# Patient Record
Sex: Female | Born: 1980 | Race: Black or African American | Hispanic: No | Marital: Married | Smoking: Never smoker
Health system: Southern US, Community
[De-identification: ages and names within clinical notes are randomized; demographics above are authoritative.]

## PROBLEM LIST (undated history)

## (undated) DIAGNOSIS — J301 Allergic rhinitis due to pollen: Secondary | ICD-10-CM

## (undated) DIAGNOSIS — Z8619 Personal history of other infectious and parasitic diseases: Secondary | ICD-10-CM

## (undated) DIAGNOSIS — Z789 Other specified health status: Secondary | ICD-10-CM

## (undated) HISTORY — DX: Allergic rhinitis due to pollen: J30.1

## (undated) HISTORY — DX: Personal history of other infectious and parasitic diseases: Z86.19

---

## 2011-05-13 ENCOUNTER — Other Ambulatory Visit: Payer: Self-pay

## 2011-07-30 ENCOUNTER — Other Ambulatory Visit (HOSPITAL_COMMUNITY): Payer: Self-pay | Admitting: Obstetrics and Gynecology

## 2011-07-30 DIAGNOSIS — R638 Other symptoms and signs concerning food and fluid intake: Secondary | ICD-10-CM

## 2011-08-28 ENCOUNTER — Ambulatory Visit (HOSPITAL_COMMUNITY)
Admission: RE | Admit: 2011-08-28 | Discharge: 2011-08-28 | Disposition: A | Discharge status: Home / Self Care | Payer: BC Managed Care – PPO | Source: Ambulatory Visit | Attending: Obstetrics and Gynecology | Admitting: Obstetrics and Gynecology

## 2011-08-28 ENCOUNTER — Encounter (HOSPITAL_COMMUNITY): Payer: Self-pay

## 2011-08-28 DIAGNOSIS — O358XX0 Maternal care for other (suspected) fetal abnormality and damage, not applicable or unspecified: Secondary | ICD-10-CM | POA: Insufficient documentation

## 2011-08-28 DIAGNOSIS — E669 Obesity, unspecified: Secondary | ICD-10-CM | POA: Insufficient documentation

## 2011-08-28 DIAGNOSIS — O34219 Maternal care for unspecified type scar from previous cesarean delivery: Secondary | ICD-10-CM | POA: Insufficient documentation

## 2011-08-28 DIAGNOSIS — Z363 Encounter for antenatal screening for malformations: Secondary | ICD-10-CM | POA: Insufficient documentation

## 2011-08-28 DIAGNOSIS — O99210 Obesity complicating pregnancy, unspecified trimester: Secondary | ICD-10-CM | POA: Insufficient documentation

## 2011-08-28 DIAGNOSIS — Z1389 Encounter for screening for other disorder: Secondary | ICD-10-CM | POA: Insufficient documentation

## 2011-08-28 DIAGNOSIS — R638 Other symptoms and signs concerning food and fluid intake: Secondary | ICD-10-CM

## 2011-11-21 ENCOUNTER — Encounter (HOSPITAL_COMMUNITY): Payer: Self-pay | Admitting: Pharmacist

## 2011-12-03 ENCOUNTER — Encounter (HOSPITAL_COMMUNITY): Payer: Self-pay

## 2011-12-03 ENCOUNTER — Encounter (HOSPITAL_COMMUNITY)
Admission: RE | Admit: 2011-12-03 | Discharge: 2011-12-03 | Disposition: A | Discharge status: Home / Self Care | Payer: BC Managed Care – PPO | Source: Ambulatory Visit | Attending: Obstetrics and Gynecology | Admitting: Obstetrics and Gynecology

## 2011-12-03 HISTORY — DX: Other specified health status: Z78.9

## 2011-12-03 LAB — CBC
MCV: 80.8 fL (ref 78.0–100.0)
Platelets: 146 10*3/uL — ABNORMAL LOW (ref 150–400)
RDW: 15 % (ref 11.5–15.5)
WBC: 10.2 10*3/uL (ref 4.0–10.5)

## 2011-12-03 NOTE — Patient Instructions (Addendum)
YOUR PROCEDURE IS SCHEDULED ON:12/09/11  ENTER THROUGH THE MAIN ENTRANCE OF [facility] AT:1130am  USE DESK PHONE AND DIAL [phone] TO INFORM us OF YOUR ARRIVAL  CALL [phone] IF YOU HAVE ANY QUESTIONS OR PROBLEMS PRIOR TO YOUR ARRIVAL.  REMEMBER: DO NOT EAT AFTER MIDNIGHT : Sunday  SPECIAL INSTRUCTIONS:clear liquids ok til 9am Monday   YOU MAY BRUSH YOUR TEETH THE MORNING OF SURGERY   TAKE THESE MEDICINES THE DAY OF SURGERY WITH SIP OF WATER:none   DO NOT WEAR JEWELRY, EYE MAKEUP, LIPSTICK OR DARK FINGERNAIL POLISH DO NOT WEAR LOTIONS  DO NOT SHAVE FOR 48 HOURS PRIOR TO SURGERY  YOU WILL NOT BE ALLOWED TO DRIVE YOURSELF HOME.  NAME OF DRIVER:Wilson Konrad Dolores

## 2011-12-09 ENCOUNTER — Encounter (HOSPITAL_COMMUNITY): Payer: Self-pay | Admitting: Anesthesiology

## 2011-12-09 ENCOUNTER — Inpatient Hospital Stay (HOSPITAL_COMMUNITY): Payer: BC Managed Care – PPO | Admitting: Anesthesiology

## 2011-12-09 ENCOUNTER — Encounter (HOSPITAL_COMMUNITY): Payer: Self-pay | Admitting: *Deleted

## 2011-12-09 ENCOUNTER — Inpatient Hospital Stay (HOSPITAL_COMMUNITY)
Admission: RE | Admit: 2011-12-09 | Discharge: 2011-12-12 | DRG: 371 | Disposition: A | Discharge status: Home / Self Care | Payer: BC Managed Care – PPO | Source: Ambulatory Visit | Attending: Obstetrics and Gynecology | Admitting: Obstetrics and Gynecology

## 2011-12-09 ENCOUNTER — Encounter (HOSPITAL_COMMUNITY)
Admission: RE | Disposition: A | Discharge status: Home / Self Care | Payer: Self-pay | Source: Ambulatory Visit | Attending: Obstetrics and Gynecology

## 2011-12-09 DIAGNOSIS — O34219 Maternal care for unspecified type scar from previous cesarean delivery: Principal | ICD-10-CM | POA: Diagnosis present

## 2011-12-09 LAB — CBC
MCH: 25.9 pg — ABNORMAL LOW (ref 26.0–34.0)
Platelets: 169 10*3/uL (ref 150–400)
RBC: 4.79 MIL/uL (ref 3.87–5.11)
WBC: 9.3 10*3/uL (ref 4.0–10.5)

## 2011-12-09 SURGERY — Surgical Case
Anesthesia: Spinal | Wound class: Clean Contaminated

## 2011-12-09 MED ORDER — OXYTOCIN 10 UNIT/ML IJ SOLN
INTRAMUSCULAR | Status: AC
  Filled 2011-12-09: qty 2

## 2011-12-09 MED ORDER — HYDROMORPHONE HCL PF 1 MG/ML IJ SOLN
0.5000 mg | INTRAMUSCULAR | Status: DC | PRN
  Administered 2011-12-09: 0.5 mg via INTRAVENOUS

## 2011-12-09 MED ORDER — ONDANSETRON HCL 4 MG PO TABS: 4.0000 mg | ORAL_TABLET | ORAL | Status: DC | PRN

## 2011-12-09 MED ORDER — LANOLIN HYDROUS EX OINT: 1.0000 "application " | TOPICAL_OINTMENT | CUTANEOUS | Status: DC | PRN

## 2011-12-09 MED ORDER — PRENATAL MULTIVITAMIN CH
1.0000 | ORAL_TABLET | Freq: Every day | ORAL | Status: DC
  Administered 2011-12-10 – 2011-12-12 (×3): 1 via ORAL
  Filled 2011-12-09 (×3): qty 1

## 2011-12-09 MED ORDER — OXYTOCIN 20 UNITS IN LACTATED RINGERS INFUSION - SIMPLE: 125.0000 mL/h | INTRAVENOUS | Status: AC

## 2011-12-09 MED ORDER — DIPHENHYDRAMINE HCL 50 MG/ML IJ SOLN: 12.5000 mg | INTRAMUSCULAR | Status: DC | PRN

## 2011-12-09 MED ORDER — KETOROLAC TROMETHAMINE 60 MG/2ML IM SOLN
INTRAMUSCULAR | Status: AC
  Administered 2011-12-09: 60 mg via INTRAMUSCULAR
  Filled 2011-12-09: qty 2

## 2011-12-09 MED ORDER — ONDANSETRON HCL 4 MG/2ML IJ SOLN
INTRAMUSCULAR | Status: DC | PRN
  Administered 2011-12-09: 4 mg via INTRAVENOUS

## 2011-12-09 MED ORDER — PHENYLEPHRINE 40 MCG/ML (10ML) SYRINGE FOR IV PUSH (FOR BLOOD PRESSURE SUPPORT)
PREFILLED_SYRINGE | INTRAVENOUS | Status: AC
  Filled 2011-12-09: qty 10

## 2011-12-09 MED ORDER — ONDANSETRON HCL 4 MG/2ML IJ SOLN: 4.0000 mg | INTRAMUSCULAR | Status: DC | PRN

## 2011-12-09 MED ORDER — ONDANSETRON HCL 4 MG/2ML IJ SOLN
INTRAMUSCULAR | Status: AC
  Administered 2011-12-09: 4 mg
  Filled 2011-12-09: qty 2

## 2011-12-09 MED ORDER — FENTANYL CITRATE 0.05 MG/ML IJ SOLN
INTRAMUSCULAR | Status: AC
  Filled 2011-12-09: qty 2

## 2011-12-09 MED ORDER — KETOROLAC TROMETHAMINE 30 MG/ML IJ SOLN: 30.0000 mg | Freq: Four times a day (QID) | INTRAMUSCULAR | Status: AC | PRN

## 2011-12-09 MED ORDER — CEFAZOLIN SODIUM-DEXTROSE 2-3 GM-% IV SOLR
2.0000 g | INTRAVENOUS | Status: AC
  Administered 2011-12-09: 2 g via INTRAVENOUS
  Filled 2011-12-09: qty 50

## 2011-12-09 MED ORDER — MEDROXYPROGESTERONE ACETATE 150 MG/ML IM SUSP: 150.0000 mg | INTRAMUSCULAR | Status: DC | PRN

## 2011-12-09 MED ORDER — MORPHINE SULFATE 0.5 MG/ML IJ SOLN
INTRAMUSCULAR | Status: AC
  Filled 2011-12-09: qty 10

## 2011-12-09 MED ORDER — SODIUM CHLORIDE 0.9 % IV SOLN: 1.0000 ug/kg/h | INTRAVENOUS | Status: DC | PRN

## 2011-12-09 MED ORDER — IBUPROFEN 600 MG PO TABS
600.0000 mg | ORAL_TABLET | Freq: Four times a day (QID) | ORAL | Status: DC
  Administered 2011-12-10 – 2011-12-12 (×10): 600 mg via ORAL
  Filled 2011-12-09 (×5): qty 1

## 2011-12-09 MED ORDER — DIPHENHYDRAMINE HCL 25 MG PO CAPS: 25.0000 mg | ORAL_CAPSULE | Freq: Four times a day (QID) | ORAL | Status: DC | PRN

## 2011-12-09 MED ORDER — OXYCODONE-ACETAMINOPHEN 5-325 MG PO TABS
1.0000 | ORAL_TABLET | ORAL | Status: DC | PRN
  Administered 2011-12-11 (×3): 1 via ORAL
  Filled 2011-12-09 (×2): qty 1
  Filled 2011-12-09: qty 2

## 2011-12-09 MED ORDER — MORPHINE SULFATE (PF) 0.5 MG/ML IJ SOLN
INTRAMUSCULAR | Status: DC | PRN
  Administered 2011-12-09: .15 ug via INTRATHECAL

## 2011-12-09 MED ORDER — DIPHENHYDRAMINE HCL 50 MG/ML IJ SOLN: 25.0000 mg | INTRAMUSCULAR | Status: DC | PRN

## 2011-12-09 MED ORDER — PHENYLEPHRINE HCL 10 MG/ML IJ SOLN
INTRAMUSCULAR | Status: DC | PRN
  Administered 2011-12-09: 40 ug via INTRAVENOUS
  Administered 2011-12-09 (×3): 80 ug via INTRAVENOUS

## 2011-12-09 MED ORDER — KETOROLAC TROMETHAMINE 60 MG/2ML IM SOLN
60.0000 mg | Freq: Once | INTRAMUSCULAR | Status: AC | PRN
  Administered 2011-12-09: 60 mg via INTRAMUSCULAR

## 2011-12-09 MED ORDER — SODIUM CHLORIDE 0.9 % IJ SOLN: 3.0000 mL | INTRAMUSCULAR | Status: DC | PRN

## 2011-12-09 MED ORDER — SCOPOLAMINE 1 MG/3DAYS TD PT72
MEDICATED_PATCH | TRANSDERMAL | Status: AC
  Administered 2011-12-09: 1.5 mg
  Filled 2011-12-09: qty 1

## 2011-12-09 MED ORDER — LACTATED RINGERS IV SOLN
INTRAVENOUS | Status: DC | PRN
  Administered 2011-12-09 (×3): via INTRAVENOUS

## 2011-12-09 MED ORDER — CEFAZOLIN SODIUM 1-5 GM-% IV SOLN
INTRAVENOUS | Status: AC
  Filled 2011-12-09: qty 100

## 2011-12-09 MED ORDER — METOCLOPRAMIDE HCL 5 MG/ML IJ SOLN
10.0000 mg | Freq: Three times a day (TID) | INTRAMUSCULAR | Status: DC | PRN
  Administered 2011-12-09: 10 mg via INTRAVENOUS

## 2011-12-09 MED ORDER — DIBUCAINE 1 % RE OINT: 1.0000 "application " | TOPICAL_OINTMENT | RECTAL | Status: DC | PRN

## 2011-12-09 MED ORDER — ONDANSETRON HCL 4 MG/2ML IJ SOLN: 4.0000 mg | Freq: Three times a day (TID) | INTRAMUSCULAR | Status: DC | PRN

## 2011-12-09 MED ORDER — ONDANSETRON HCL 4 MG/2ML IJ SOLN
INTRAMUSCULAR | Status: AC
  Filled 2011-12-09: qty 2

## 2011-12-09 MED ORDER — PROMETHAZINE HCL 25 MG/ML IJ SOLN
INTRAMUSCULAR | Status: AC
  Administered 2011-12-09: 6.25 mg via INTRAVENOUS
  Filled 2011-12-09: qty 1

## 2011-12-09 MED ORDER — DIPHENHYDRAMINE HCL 25 MG PO CAPS: 25.0000 mg | ORAL_CAPSULE | ORAL | Status: DC | PRN

## 2011-12-09 MED ORDER — DEXTROSE IN LACTATED RINGERS 5 % IV SOLN
INTRAVENOUS | Status: DC
  Administered 2011-12-10: 01:00:00 via INTRAVENOUS

## 2011-12-09 MED ORDER — HYDROMORPHONE HCL PF 1 MG/ML IJ SOLN
INTRAMUSCULAR | Status: AC
  Administered 2011-12-09: 0.5 mg via INTRAVENOUS
  Filled 2011-12-09: qty 1

## 2011-12-09 MED ORDER — NALBUPHINE HCL 10 MG/ML IJ SOLN: 5.0000 mg | INTRAMUSCULAR | Status: DC | PRN

## 2011-12-09 MED ORDER — FENTANYL CITRATE 0.05 MG/ML IJ SOLN: 25.0000 ug | INTRAMUSCULAR | Status: DC | PRN

## 2011-12-09 MED ORDER — CEFAZOLIN SODIUM-DEXTROSE 2-3 GM-% IV SOLR
2.0000 g | INTRAVENOUS | Status: DC
  Filled 2011-12-09: qty 50

## 2011-12-09 MED ORDER — BUPIVACAINE HCL 0.75 % IJ SOLN
INTRAMUSCULAR | Status: DC | PRN
  Administered 2011-12-09: 1.4 mL via INTRATHECAL

## 2011-12-09 MED ORDER — FENTANYL CITRATE 0.05 MG/ML IJ SOLN
INTRAMUSCULAR | Status: DC | PRN
  Administered 2011-12-09: 25 ug via INTRATHECAL

## 2011-12-09 MED ORDER — SIMETHICONE 80 MG PO CHEW: 80.0000 mg | CHEWABLE_TABLET | ORAL | Status: DC | PRN

## 2011-12-09 MED ORDER — TETANUS-DIPHTH-ACELL PERTUSSIS 5-2.5-18.5 LF-MCG/0.5 IM SUSP
0.5000 mL | Freq: Once | INTRAMUSCULAR | Status: AC
  Administered 2011-12-11: 0.5 mL via INTRAMUSCULAR

## 2011-12-09 MED ORDER — IBUPROFEN 600 MG PO TABS
600.0000 mg | ORAL_TABLET | Freq: Four times a day (QID) | ORAL | Status: DC | PRN
  Filled 2011-12-09 (×4): qty 1

## 2011-12-09 MED ORDER — NALOXONE HCL 0.4 MG/ML IJ SOLN: 0.4000 mg | INTRAMUSCULAR | Status: DC | PRN

## 2011-12-09 MED ORDER — METOCLOPRAMIDE HCL 5 MG/ML IJ SOLN
INTRAMUSCULAR | Status: AC
  Administered 2011-12-09: 10 mg via INTRAVENOUS
  Filled 2011-12-09: qty 2

## 2011-12-09 MED ORDER — MEASLES, MUMPS & RUBELLA VAC ~~LOC~~ INJ: 0.5000 mL | INJECTION | Freq: Once | SUBCUTANEOUS | Status: DC

## 2011-12-09 MED ORDER — OXYTOCIN 10 UNIT/ML IJ SOLN
INTRAMUSCULAR | Status: DC | PRN
  Administered 2011-12-09: 20 [IU] via INTRAMUSCULAR

## 2011-12-09 MED ORDER — SENNOSIDES-DOCUSATE SODIUM 8.6-50 MG PO TABS
2.0000 | ORAL_TABLET | Freq: Every day | ORAL | Status: DC
  Administered 2011-12-09: 1 via ORAL
  Administered 2011-12-10 – 2011-12-11 (×2): 2 via ORAL

## 2011-12-09 MED ORDER — WITCH HAZEL-GLYCERIN EX PADS: 1.0000 "application " | MEDICATED_PAD | CUTANEOUS | Status: DC | PRN

## 2011-12-09 MED ORDER — MENTHOL 3 MG MT LOZG: 1.0000 | LOZENGE | OROMUCOSAL | Status: DC | PRN

## 2011-12-09 MED ORDER — SIMETHICONE 80 MG PO CHEW
80.0000 mg | CHEWABLE_TABLET | Freq: Three times a day (TID) | ORAL | Status: DC
  Administered 2011-12-09 – 2011-12-12 (×10): 80 mg via ORAL

## 2011-12-09 MED ORDER — PROMETHAZINE HCL 25 MG/ML IJ SOLN
6.2500 mg | INTRAMUSCULAR | Status: DC | PRN
  Administered 2011-12-09: 6.25 mg via INTRAVENOUS

## 2011-12-09 SURGICAL SUPPLY — 25 items
CHLORAPREP W/TINT 26ML (MISCELLANEOUS) ×2 IMPLANT
CLOTH BEACON ORANGE TIMEOUT ST (SAFETY) ×2 IMPLANT
ELECT REM PT RETURN 9FT ADLT (ELECTROSURGICAL) ×2
ELECTRODE REM PT RTRN 9FT ADLT (ELECTROSURGICAL) ×1 IMPLANT
EXTRACTOR VACUUM M CUP 4 TUBE (SUCTIONS) IMPLANT
GLOVE BIO SURGEON STRL SZ 6.5 (GLOVE) ×2 IMPLANT
GLOVE BIOGEL PI IND STRL 7.0 (GLOVE) ×2 IMPLANT
GLOVE BIOGEL PI INDICATOR 7.0 (GLOVE) ×2
GOWN PREVENTION PLUS LG XLONG (DISPOSABLE) ×6 IMPLANT
GOWN STRL REIN XL XLG (GOWN DISPOSABLE) ×2 IMPLANT
KIT ABG SYR 3ML LUER SLIP (SYRINGE) ×2 IMPLANT
NEEDLE HYPO 25X5/8 SAFETYGLIDE (NEEDLE) ×2 IMPLANT
NS IRRIG 1000ML POUR BTL (IV SOLUTION) ×2 IMPLANT
PACK C SECTION WH (CUSTOM PROCEDURE TRAY) ×2 IMPLANT
SLEEVE SCD COMPRESS KNEE MED (MISCELLANEOUS) IMPLANT
STAPLER VISISTAT 35W (STAPLE) IMPLANT
SUT CHROMIC 0 CT 802H (SUTURE) IMPLANT
SUT CHROMIC 0 CTX 36 (SUTURE) ×6 IMPLANT
SUT MON AB-0 CT1 36 (SUTURE) ×2 IMPLANT
SUT PDS AB 0 CTX 60 (SUTURE) ×2 IMPLANT
SUT PLAIN 0 NONE (SUTURE) IMPLANT
SUT VIC AB 4-0 KS 27 (SUTURE) IMPLANT
TOWEL OR 17X24 6PK STRL BLUE (TOWEL DISPOSABLE) ×4 IMPLANT
TRAY FOLEY CATH 14FR (SET/KITS/TRAYS/PACK) IMPLANT
WATER STERILE IRR 1000ML POUR (IV SOLUTION) ×2 IMPLANT

## 2011-12-09 NOTE — H&P (Signed)
31 yo G2P1@ 39+3 wks presents for repeat c-section.  Declines TOL  Past History - see hollister All - none  AF, VSS Gen - NAD Abd - gravid, NT Ext - NT PV - deferred  Plts 146  A/P:  Prior c-section, desires repeat R/B/A discussed, informed consent

## 2011-12-09 NOTE — Transfer of Care (Signed)
Immediate Anesthesia Transfer of Care Note  Patient: Claudia Ferguson  Procedure(s) Performed: Procedure(s) (LRB): CESAREAN SECTION (N/A)  Patient Location: PACU  Anesthesia Type: Spinal  Level of Consciousness: awake  Airway & Oxygen Therapy: Patient Spontanous Breathing  Post-op Assessment: Report given to PACU RN  Post vital signs: Reviewed and stable  Complications: No apparent anesthesia complications

## 2011-12-09 NOTE — Anesthesia Preprocedure Evaluation (Signed)
Anesthesia Evaluation  Patient identified by MRN, date of birth, ID band Patient awake    Reviewed: Allergy & Precautions, H&P , Patient's Chart, lab work & pertinent test results  Airway Mallampati: III TM Distance: >3 FB Neck ROM: Full    Dental No notable dental hx. (+) Teeth Intact   Pulmonary neg pulmonary ROS,  breath sounds clear to auscultation  Pulmonary exam normal       Cardiovascular negative cardio ROS  Rhythm:Regular Rate:Normal     Neuro/Psych negative neurological ROS  negative psych ROS   GI/Hepatic negative GI ROS, Neg liver ROS,   Endo/Other  Morbid obesity  Renal/GU negative Renal ROS  negative genitourinary   Musculoskeletal   Abdominal Normal abdominal exam  (+)   Peds  Hematology negative hematology ROS (+)   Anesthesia Other Findings   Reproductive/Obstetrics (+) Pregnancy                           Anesthesia Physical Anesthesia Plan  ASA: III  Anesthesia Plan: Spinal   Post-op Pain Management:    Induction:   Airway Management Planned:   Additional Equipment:   Intra-op Plan:   Post-operative Plan:   Informed Consent: I have reviewed the patients History and Physical, chart, labs and discussed the procedure including the risks, benefits and alternatives for the proposed anesthesia with the patient or authorized representative who has indicated his/her understanding and acceptance.     Plan Discussed with: Anesthesiologist  Anesthesia Plan Comments:         Anesthesia Quick Evaluation

## 2011-12-09 NOTE — Op Note (Signed)
Cesarean Section Procedure Note   Claudia Ferguson  12/09/2011  Indications: Scheduled Proceedure/Maternal Request   Pre-operative Diagnosis: Previous Cesarean Section.   Post-operative Diagnosis: Same   Surgeon: Surgeon(s) and Role:    * Zelphia Cairo, MD - Primary   Assistants: none  Anesthesia: spinal   Procedure Details:  The patient was seen in the Holding Room. The risks, benefits, complications, treatment options, and expected outcomes were discussed with the patient. The patient concurred with the proposed plan, giving informed consent. identified as Claudia Ferguson and the procedure verified as C-Section Delivery. A Time Out was held and the above information confirmed.  After induction of anesthesia, the patient was draped and prepped in the usual sterile manner. A transverse was made and carried down through the subcutaneous tissue to the fascia. Fascial incision was made and extended transversely. The fascia was separated from the underlying rectus tissue superiorly and inferiorly. The peritoneum was identified and entered. Peritoneal incision was extended longitudinally. The utero-vesical peritoneal reflection was incised transversely and the bladder flap was bluntly freed from the lower uterine segment. A low transverse uterine incision was made. Delivered from cephalic presentation was a female infant with Apgar scores of 8 at one minute and 9 at five minutes. Cord ph was not sent the umbilical cord was clamped and cut cord blood was obtained for evaluation. The placenta was removed Intact and appeared normal. The uterine outline, tubes and ovaries appeared normal}. The uterine incision was closed with running locked sutures of 0chromic gut.   Hemostasis was observed. Lavage was carried out until clear. The fascia was then reapproximated with running sutures of 0PDS. The skin was closed with staples  Instrument, sponge, and needle counts were correct prior the abdominal closure and  were correct at the conclusion of the case.     Estimated Blood Loss: 800cc  Urine Output: clear  Specimens: @ORSPECIMEN @   Complications: no complications  Disposition: PACU - hemodynamically stable.   Maternal Condition: stable   Baby condition / location:  nursery-stable  Attending Attestation: I was present and scrubbed for the entire procedure.   Signed: Surgeon(s): Zelphia Cairo, MD

## 2011-12-09 NOTE — Anesthesia Procedure Notes (Signed)
Spinal  Patient location during procedure: OR Preanesthetic Checklist Completed: patient identified, site marked, surgical consent, pre-op evaluation, timeout performed, IV checked, risks and benefits discussed and monitors and equipment checked Spinal Block Patient position: sitting Prep: DuraPrep Patient monitoring: cardiac monitor, continuous pulse ox, blood pressure and heart rate Approach: midline Location: L3-4 Injection technique: catheter Needle Needle type: Tuohy and Sprotte  Needle gauge: 24 G Needle length: 12.7 cm Needle insertion depth: 10 cm Catheter type: closed end flexible Catheter size: 19 g Additional Notes 25 Ga Sprotte Spinal Dosage in OR  Bupivicaine ml       1.4 PFMS04   mcg        150 Fentanyl mcg            25

## 2011-12-09 NOTE — Anesthesia Postprocedure Evaluation (Signed)
  Anesthesia Post-op Note  Patient: Claudia Ferguson  Procedure(s) Performed: Procedure(s) (LRB): CESAREAN SECTION (N/A)  Patient Location: PACU  Anesthesia Type: Spinal  Level of Consciousness: awake, alert  and oriented  Airway and Oxygen Therapy: Patient Spontanous Breathing  Post-op Pain: mild  Post-op Assessment: Post-op Vital signs reviewed, Patient's Cardiovascular Status Stable, Respiratory Function Stable, Patent Airway, No signs of Nausea or vomiting, Pain level controlled and No headache  Post-op Vital Signs: Reviewed and stable  Complications: No apparent anesthesia complications

## 2011-12-10 ENCOUNTER — Encounter (HOSPITAL_COMMUNITY): Payer: Self-pay | Admitting: Obstetrics and Gynecology

## 2011-12-10 LAB — CBC
Hemoglobin: 10.4 g/dL — ABNORMAL LOW (ref 12.0–15.0)
MCV: 80.8 fL (ref 78.0–100.0)
Platelets: 153 10*3/uL (ref 150–400)
RBC: 4.07 MIL/uL (ref 3.87–5.11)
WBC: 8.8 10*3/uL (ref 4.0–10.5)

## 2011-12-10 MED ORDER — MEPERIDINE HCL 25 MG/ML IJ SOLN: 6.2500 mg | INTRAMUSCULAR | Status: DC | PRN

## 2011-12-10 NOTE — Progress Notes (Signed)
Subjective: Postpartum Day 1 : Cesarean Delivery Patient reports incisional pain, tolerating PO and no problems voiding.    Objective: Vital signs in last 24 hours: Temp:  [97.2 F (36.2 C)-98.3 F (36.8 C)] 97.3 F (36.3 C) (06/04 0500) Pulse Rate:  [74-105] 90  (06/04 0500) Resp:  [16-24] 18  (06/04 0500) BP: (100-141)/(59-87) 112/71 mmHg (06/04 0500) SpO2:  [97 %-100 %] 98 % (06/04 0500) Weight:  [136.079 kg (300 lb)] 136.079 kg (300 lb) (06/03 1601)  Physical Exam:  General: alert, cooperative and appears stated age Lochia: appropriate Uterine Fundus: firm Incision: healing well DVT Evaluation: No evidence of DVT seen on physical exam.   Basename 12/10/11 0540 12/09/11 1229  HGB 10.4* 12.4  HCT 32.9* 38.5    Assessment/Plan: Status post Cesarean section. Doing well postoperatively.  Continue current care.  Aquanetta Schwarz L 12/10/2011, 8:29 AM

## 2011-12-11 NOTE — Progress Notes (Signed)
Post Partum Day 2 Subjective: no complaints and up ad lib  Objective: Blood pressure 124/85, pulse 90, temperature 97.3 F (36.3 C), temperature source Oral, resp. rate 20, weight 136.079 kg (300 lb), last menstrual period 02/28/2011, SpO2 98.00%, unknown if currently breastfeeding.  Physical Exam:  General: alert and cooperative Lochia: appropriate Uterine Fundus: firm Incision: healing well, no significant drainage DVT Evaluation: No evidence of DVT seen on physical exam.   Basename 12/10/11 0540 12/09/11 1229  HGB 10.4* 12.4  HCT 32.9* 38.5    Assessment/Plan: Plan for discharge tomorrow   LOS: 2 days   Berklie Dethlefs 12/11/2011, 9:34 AM

## 2011-12-12 MED ORDER — OXYCODONE-ACETAMINOPHEN 5-500 MG PO CAPS: 1.0000 | ORAL_CAPSULE | ORAL | Status: AC | PRN

## 2011-12-12 NOTE — Progress Notes (Signed)
Subjective: Postpartum Day three: Cesarean Delivery Patient reports tolerating PO, + flatus, + BM and no problems voiding.    Objective: Vital signs in last 24 hours: Temp:  [97.7 F (36.5 C)-98.1 F (36.7 C)] 97.7 F (36.5 C) (06/06 0609) Pulse Rate:  [80-90] 80  (06/06 0609) Resp:  [18-19] 18  (06/06 0609) BP: (116-128)/(72-83) 128/83 mmHg (06/06 1610)  Physical Exam:  General: alert Lochia: appropriate Uterine Fundus: firm Incision: healing well DVT Evaluation: No evidence of DVT seen on physical exam.   Basename 12/10/11 0540 12/09/11 1229  HGB 10.4* 12.4  HCT 32.9* 38.5    Assessment/Plan: Status post Cesarean section. Doing well postoperatively.  Discharge home with standard precautions and return to clinic in 4-6 weeks.  Keyna Blizard S 12/12/2011, 9:17 AM

## 2011-12-12 NOTE — Plan of Care (Signed)
Problem: Discharge Progression Outcomes Goal: Remove staples per MD order Outcome: Not Applicable Date Met:  12/12/11 Staples to be removed on Monday in MD office.

## 2011-12-12 NOTE — Discharge Summary (Signed)
Obstetric Discharge Summary Reason for Admission: cesarean section Prenatal Procedures: none Intrapartum Procedures: cesarean: low cervical, transverse Postpartum Procedures: none Complications-Operative and Postpartum: none Hemoglobin  Date Value Range Status  12/10/2011 10.4* 12.0-15.0 (g/dL) Final     HCT  Date Value Range Status  12/10/2011 32.9* 36.0-46.0 (%) Final    Physical Exam:  General: alert Lochia: appropriate Uterine Fundus: firm Incision: healing well DVT Evaluation: No evidence of DVT seen on physical exam.  Discharge Diagnoses: Term Pregnancy-delivered  Discharge Information: Date: 12/12/2011 Activity: pelvic rest Diet: routine Medications: Percocet Condition: stable Instructions: refer to practice specific booklet Discharge to: home   Newborn Data: Live born female  Birth Weight: 8 lb 0.9 oz (3655 g) APGAR: 8, 9  Home with mother.  Chrissi Crow S 12/12/2011, 9:18 AM

## 2012-08-07 IMAGING — US US OB DETAIL+14 WK
1 series · 14 of 28 positions shown · non-contrast
Comparison: none

[Series 1: us ob detail+14 wk · 0.17mm/px · 88 acquisitions, 14 frames shown]
[im 4/88]
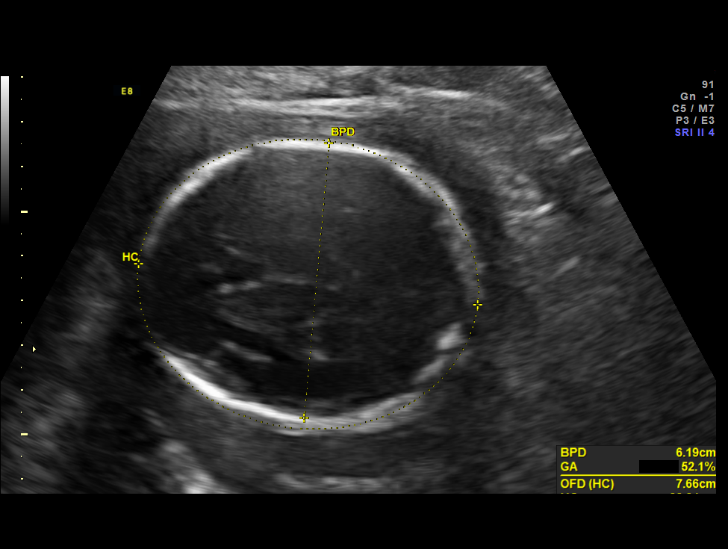
[im 10/88]
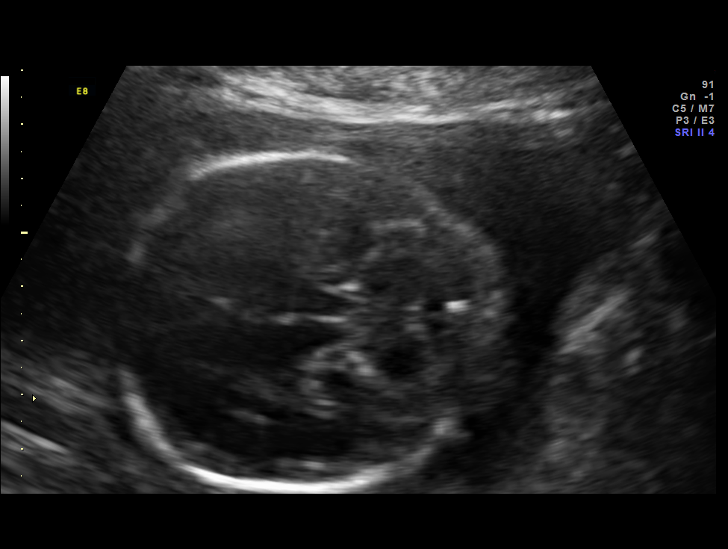
[im 17/88]
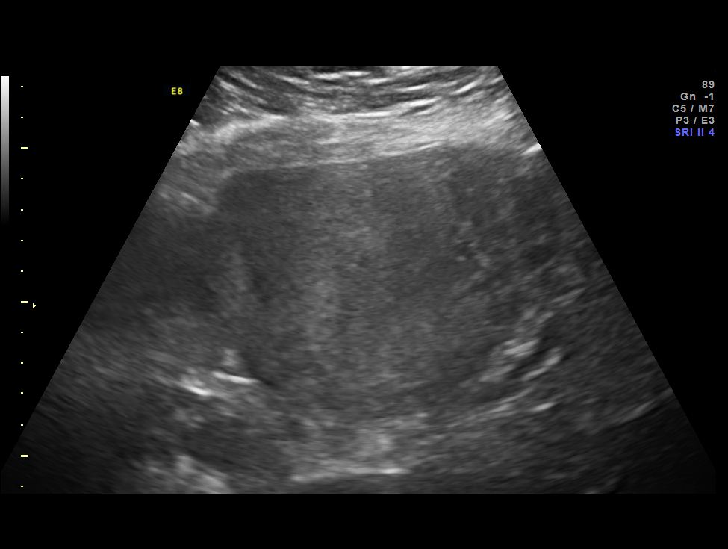
[im 23/88]
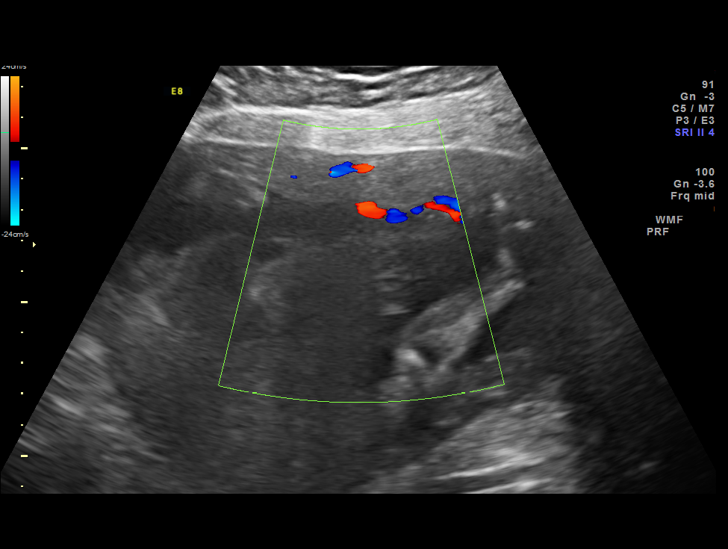
[im 30/88]
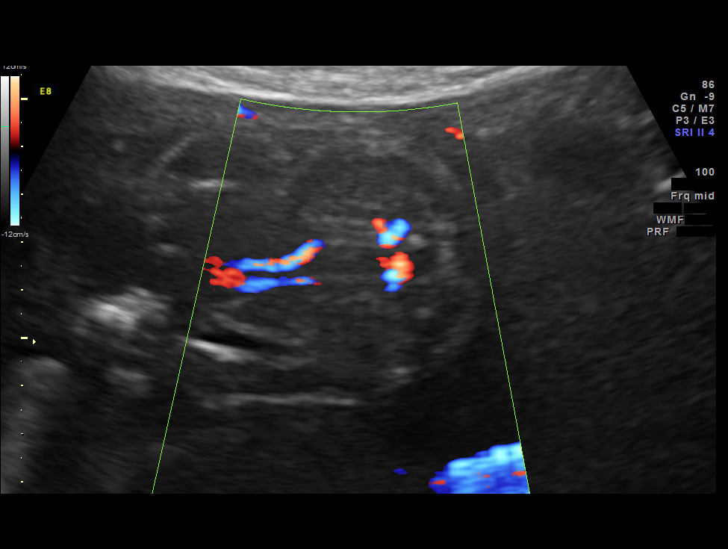
[im 36/88]
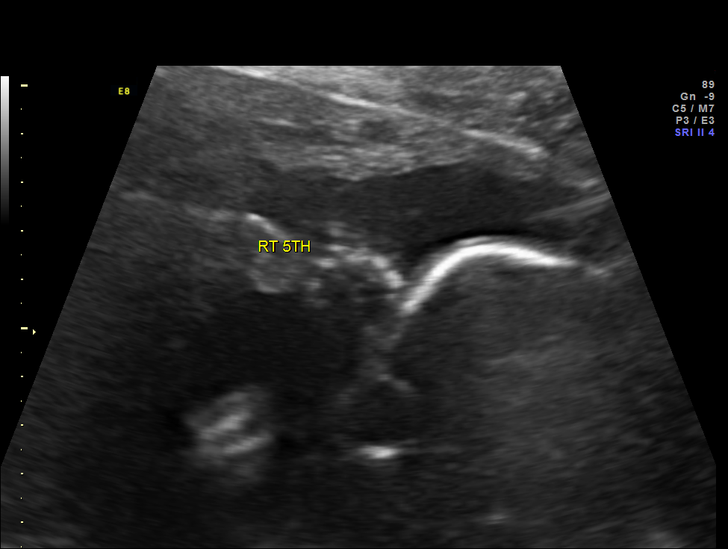
[im 42/88]
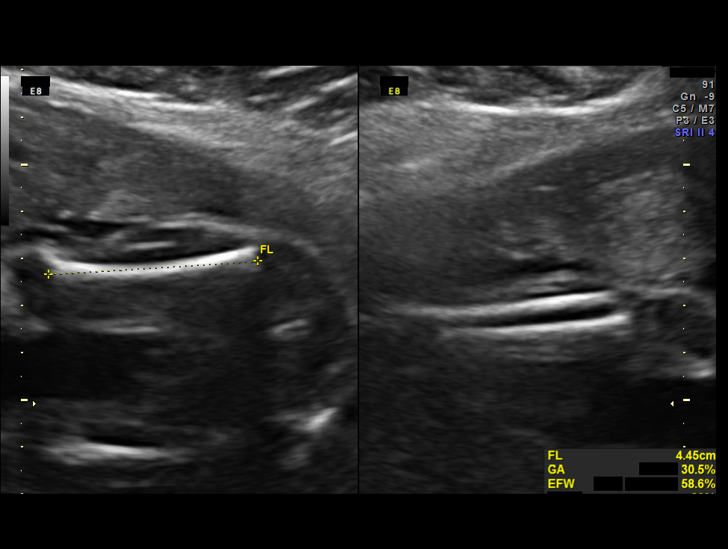
[im 49/88]
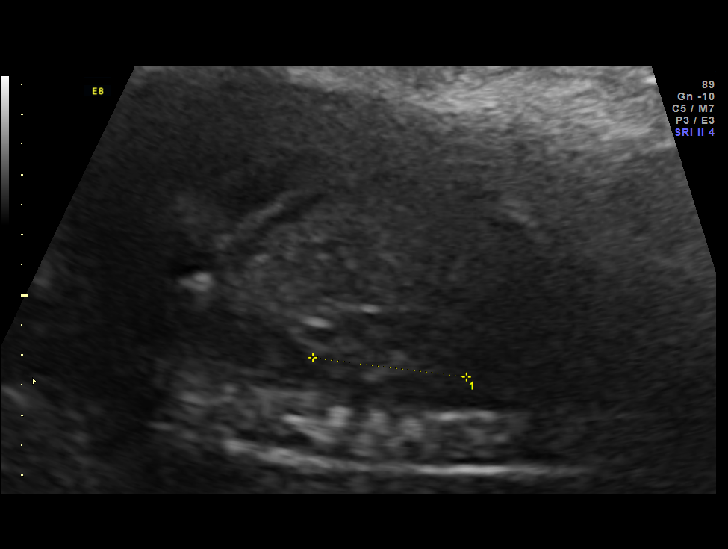
[im 55/88]
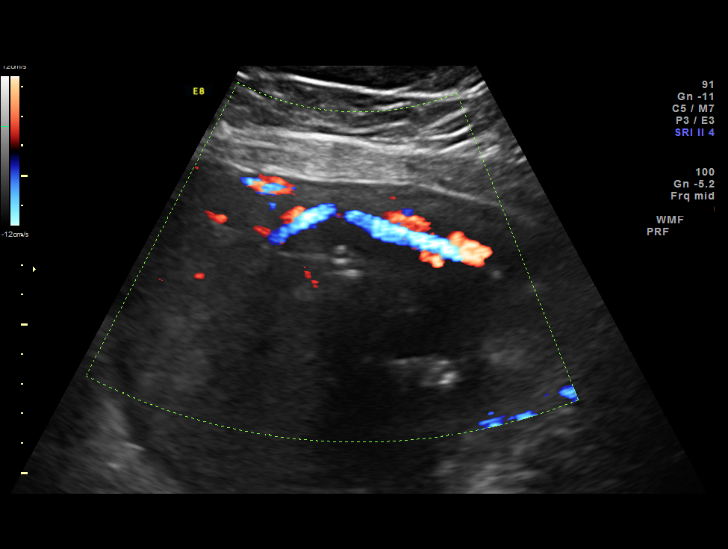
[im 62/88]
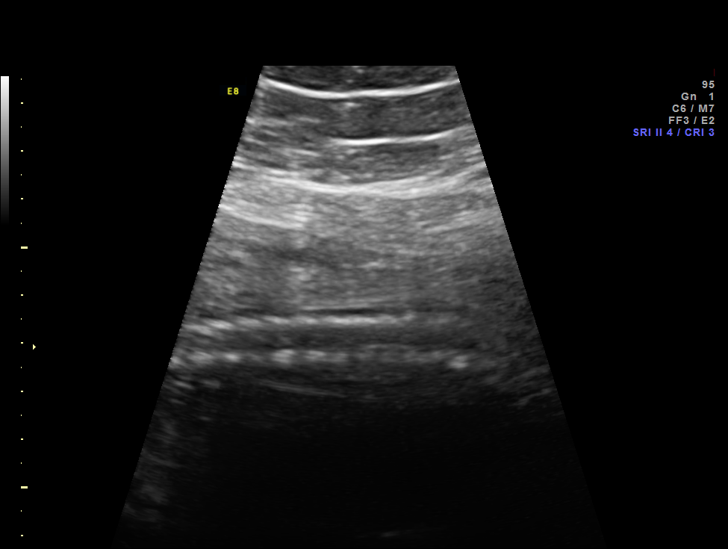
[im 68/88]
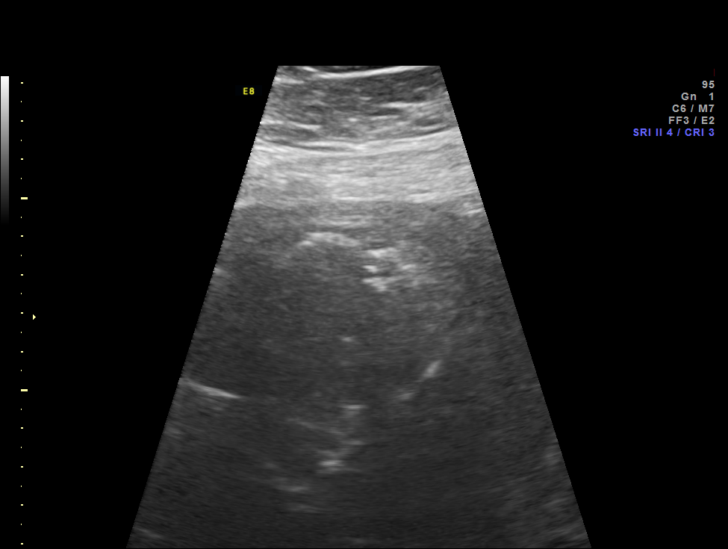
[im 75/88]
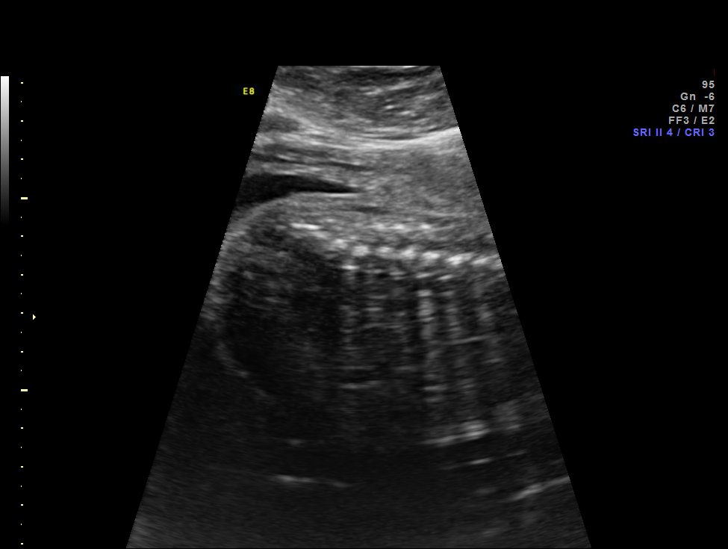
[im 81/88]
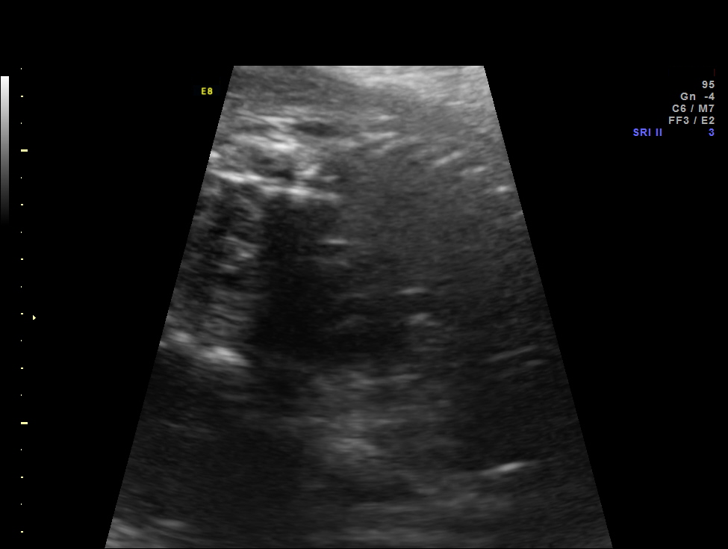
[im 88/88]
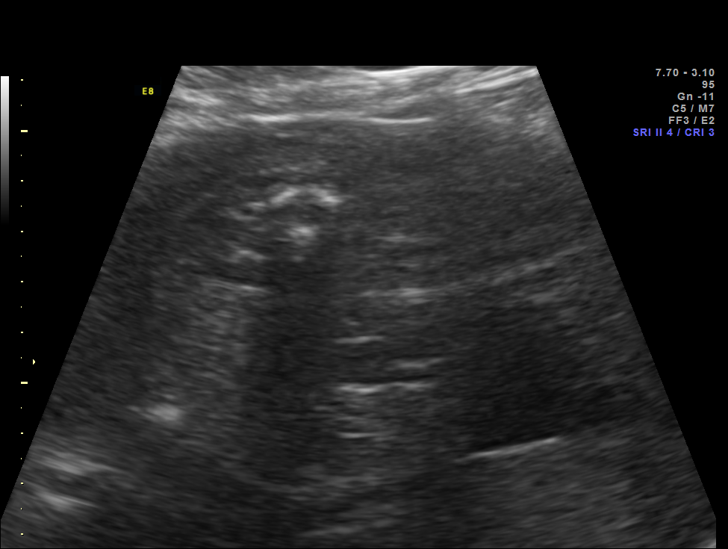

[14 of 28 positions shown; findings below may reference images not displayed]

Canned report from images found in remote index.

Refer to host system for actual result text.

## 2014-05-09 ENCOUNTER — Encounter (HOSPITAL_COMMUNITY): Payer: Self-pay | Admitting: Obstetrics and Gynecology

## 2017-04-09 ENCOUNTER — Ambulatory Visit (INDEPENDENT_AMBULATORY_CARE_PROVIDER_SITE_OTHER): Payer: BC Managed Care – PPO | Admitting: Family Medicine

## 2017-04-09 ENCOUNTER — Encounter: Payer: Self-pay | Admitting: Family Medicine

## 2017-04-09 VITALS — BP 118/78 | HR 89 | Temp 98.5°F | Ht 63.75 in | Wt 293.5 lb

## 2017-04-09 DIAGNOSIS — R110 Nausea: Secondary | ICD-10-CM | POA: Diagnosis not present

## 2017-04-09 LAB — POCT URINE PREGNANCY: Preg Test, Ur: NEGATIVE

## 2017-04-09 MED ORDER — ONDANSETRON 4 MG PO TBDP: 4.0000 mg | ORAL_TABLET | Freq: Three times a day (TID) | ORAL | 0 refills | Status: DC | PRN

## 2017-04-09 NOTE — Progress Notes (Signed)
Subjective:  Claudia Ferguson is a 36 y.o. female who presents today with a chief complaint of nausea and to establish care.   HPI:  Nausea, Acute Problem Symptoms started 2 days ago. Yesterday started having some diarrhea. She did have a few episodes of 2 days ago. Had something similar in the past, which resolved spontaneously. No medications tried. No fevers or chills. No abdominal pain. No sick contacts. No questionable food intakes.   She did have nexplanon placed about 3 years ago, but was told that she should be covered for another year.   ROS: Per HPI, otherwise a 14 point review of systems was performed and was negative  PMH:  The following were reviewed and entered/updated in epic: Past Medical History:  Diagnosis Date  . No pertinent past medical history    There are no active problems to display for this patient.  Past Surgical History:  Procedure Laterality Date  . CESAREAN SECTION    . CESAREAN SECTION  12/09/2011   Procedure: CESAREAN SECTION;  Surgeon: Zelphia Cairo, MD;  Location: [facility];  Service: Gynecology;  Laterality: N/A;    No family history on file.  Medications- reviewed and updated Current Outpatient Prescriptions  Medication Sig Dispense Refill  . etonogestrel (NEXPLANON) 68 MG IMPL implant 1 each by Subdermal route once.    . loratadine (CLARITIN) 10 MG tablet Take 10 mg by mouth daily as needed. For allergies    . ondansetron (ZOFRAN ODT) 4 MG disintegrating tablet Take 1 tablet (4 mg total) by mouth every 8 (eight) hours as needed for nausea or vomiting. 20 tablet 0   No current facility-administered medications for this visit.     Allergies-reviewed and updated No Known Allergies  Social History   Social History  . Marital status: Married    Spouse name: N/A  . Number of children: N/A  . Years of education: N/A   Social History Main Topics  . Smoking status: Never Smoker  . Smokeless tobacco: Never Used  . Alcohol use No  .  Drug use: No  . Sexual activity: Not Asked   Other Topics Concern  . None   Social History Narrative  . None   Objective:  Physical Exam: BP 118/78   Pulse 89   Temp 98.5 F (36.9 C) (Oral)   Ht 5' 3.75" (1.619 m)   Wt 293 lb 8 oz (133.1 kg)   SpO2 98%   Breastfeeding? No   BMI 50.78 kg/m   Gen: NAD, resting comfortably HEENT: TMs clear. Oropharynx clear. No lymphadenopathy.Moist mucous membranes. CV: RRR with no murmurs appreciated Pulm: NWOB, CTAB with no crackles, wheezes, or rhonchi GI: Normal bowel sounds present. Soft, Nontender, Nondistended. MSK: No edema, cyanosis, or clubbing noted Skin: Warm, dry. Normal skin turgor. Neuro: Grossly normal, moves all extremities Psych: Normal affect and thought content  Results for orders placed or performed in visit on 04/09/17 (from the past 72 hour(s))  POCT urine pregnancy     Status: Normal   Collection Time: 04/09/17 10:43 AM  Result Value Ref Range   Preg Test, Ur Negative Negative   Assessment/Plan:  Nausea and vomiting Likely secondary to viral gastroenteritis. Abdominal exam benign today. The red flag signs or symptoms. No signs of significant dehydration. Urine pregnancy test negative. We'll treat symptomatically. Prescription for Zofran given. Instructed patient to return if unable to tolerate by mouth or if symptoms worsen. Follow up axs needed.  Patient to return within a few months  for her comprehensive physical exam.  Caleb M. Jimmey Ralph, MD 04/09/2017 10:49 AM

## 2017-04-09 NOTE — Patient Instructions (Signed)

## 2017-04-10 ENCOUNTER — Encounter: Payer: Self-pay | Admitting: *Deleted

## 2018-07-23 ENCOUNTER — Telehealth: Payer: Self-pay | Admitting: Family Medicine

## 2018-07-23 NOTE — Telephone Encounter (Signed)
Ok with me.  [name], MD 07/23/2018 7:55 AM    Copied from CRM [phone]#[id number]. Topic: Appointment Scheduling - Transfer of Care >> Jul 22, 2018  2:24 PM Claudia Ferguson, Claudia Ferguson wrote: Pt is requesting to transfer FROM: Dr. Jimmey RalphParker Pt is requesting to transfer TO: Dr. Salomon FickBanks Reason for requested transfer: she is listed on insurance card - pt has [employer] insurance.  Pt can be reached at [phone]  Send CRM to patient's current PCP (transferring FROM).

## 2018-07-23 NOTE — Telephone Encounter (Signed)
Forwarding

## 2018-07-23 NOTE — Telephone Encounter (Signed)
Please advise Dr Salomon Fick if you are okaying with taking over patient care. Pt is requesting TOC to you.

## 2018-07-24 NOTE — Telephone Encounter (Signed)
ok 

## 2018-07-28 ENCOUNTER — Ambulatory Visit: Payer: BC Managed Care – PPO | Admitting: Family Medicine

## 2018-07-28 ENCOUNTER — Encounter: Payer: Self-pay | Admitting: Family Medicine

## 2018-07-28 VITALS — BP 110/80 | HR 96 | Temp 98.4°F | Ht 63.75 in | Wt 300.4 lb

## 2018-07-28 DIAGNOSIS — B001 Herpesviral vesicular dermatitis: Secondary | ICD-10-CM

## 2018-07-28 DIAGNOSIS — J329 Chronic sinusitis, unspecified: Secondary | ICD-10-CM

## 2018-07-28 MED ORDER — AMOXICILLIN-POT CLAVULANATE 875-125 MG PO TABS: 1.0000 | ORAL_TABLET | Freq: Two times a day (BID) | ORAL | 0 refills | Status: DC

## 2018-07-28 MED ORDER — VALACYCLOVIR HCL 1 G PO TABS: 2000.0000 mg | ORAL_TABLET | Freq: Two times a day (BID) | ORAL | 0 refills | Status: AC

## 2018-07-28 MED ORDER — METHYLPREDNISOLONE ACETATE 80 MG/ML IJ SUSP
80.0000 mg | Freq: Once | INTRAMUSCULAR | Status: AC
  Administered 2018-07-28: 80 mg via INTRAMUSCULAR

## 2018-07-28 NOTE — Patient Instructions (Signed)
It was very nice to see you today!  I think you have a sinus infection.  Please start the Augmentin.  Please take twice daily for the next 10 days.  It is possible cold sore virus could also explain some of your symptoms.  Please take the Valtrex.  This is a 1 day course of 2 pills twice daily.  Please stay well-hydrated.  You can take other over-the-counter medications as needed.  Please let me know if your symptoms worsen or do not improve the next few days.  Take care, Dr Jimmey RalphParker

## 2018-07-28 NOTE — Progress Notes (Signed)
   Subjective:  Claudia Ferguson is a 38 y.o. female who presents today for same-day appointment with a chief complaint of facial pain.   HPI:  Facial Pain, Acute problem Started about 2 weeks ago. Located on the right side of her face around her nose.  Pain is described as a sore sensation that is tender to touch.  Over the last couple of days has noticed increasing nasal drainage, cough, and sore throat.  No fevers or chills. Took tylenol sinus which helped for a little bit. No other sick contacts. No other obvious alleviating or aggravating factors.   Cold sore Patient has history of recurrent cold sores that typically appear when she has upper respiratory infections.  Has had a sore on the tip of her nose for the past 1 to 2 days.  She has tried Abreva cream in the past for this with no improvement.  ROS: Per HPI  PMH: She reports that she has never smoked. She has never used smokeless tobacco. She reports that she does not drink alcohol or use drugs.  Objective:  Physical Exam: BP 110/80 (BP Location: Left Arm, Patient Position: Sitting, Cuff Size: Large)   Pulse 96   Temp 98.4 F (36.9 C) (Oral)   Ht 5' 3.75" (1.619 m)   Wt (!) 300 lb 6.1 oz (136.3 kg)   SpO2 96%   BMI 51.97 kg/m   Gen: NAD, resting comfortably HEENT: TMs with clear effusion.  OP erythematous with no exudate.  Nasal mucosa erythematous and boggy bilaterally.  Maxillary sinuses with decreased transillumination bilaterally.  Erythematous, vesicular rash on distal aspect of nose noted. CV: RRR with no murmurs appreciated Pulm: NWOB, CTAB with no crackles, wheezes, or rhonchi  Assessment/Plan:  Sinusitis/cold sore Given the symptoms have been persistent for the past couple of weeks, will start Augmentin 1 pill twice daily x10 days.  Is possible that some of her facial pain could have been a prodrome to her cold sore outbreak.  We will start 1 day course of Valtrex 2000 mg twice daily for this.  Will give 80 mg IM  Depo-Medrol for pharyngitis.  Recommended good oral hydration.  Continue over-the-counter analgesics as needed.  Discussed reasons to return to care.  Follow-up as needed.  [name], MD 07/28/2018 1:05 PM

## 2018-07-29 NOTE — Telephone Encounter (Signed)
Spoke with pt stated that she will call the office to schedule a TOC appointment when ready

## 2018-08-13 ENCOUNTER — Ambulatory Visit: Payer: BC Managed Care – PPO | Admitting: Family Medicine

## 2018-08-13 ENCOUNTER — Encounter: Payer: Self-pay | Admitting: Family Medicine

## 2018-08-13 VITALS — BP 112/80 | HR 92 | Temp 97.8°F | Ht 63.75 in | Wt 291.0 lb

## 2018-08-13 DIAGNOSIS — J101 Influenza due to other identified influenza virus with other respiratory manifestations: Secondary | ICD-10-CM | POA: Diagnosis not present

## 2018-08-13 LAB — POCT INFLUENZA A/B
INFLUENZA B, POC: NEGATIVE
Influenza A, POC: POSITIVE — AB

## 2018-08-13 MED ORDER — OSELTAMIVIR PHOSPHATE 75 MG PO CAPS: 75.0000 mg | ORAL_CAPSULE | Freq: Two times a day (BID) | ORAL | 0 refills | Status: AC

## 2018-08-13 NOTE — Progress Notes (Signed)
   Chief Complaint:  Claudia Ferguson is a 38 y.o. female who presents for same day appointment with a chief complaint of fever.   Assessment/Plan:  Influenza A Rapid flu positive.  Start Tamiflu.  Recommended good oral hydration.  Continue over-the-counter medications as needed.  Discussed reasons to return to care.  Follow-up as needed.    Subjective:  HPI:  Fever, acute problem Started 2 days ago. Associated with myalgias, sweats, cough, and congestion.  Daughter has recently been sick with the flu.  She has not tried any treatments.  Symptoms seem to be worsening.No other obvious alleviating or aggravating factors.    ROS: Per HPI  PMH: She reports that she has never smoked. She has never used smokeless tobacco. She reports that she does not drink alcohol or use drugs.      Objective:  Physical Exam: BP 112/80 (BP Location: Left Arm, Patient Position: Sitting, Cuff Size: Large)   Pulse 92   Temp 97.8 F (36.6 C) (Oral)   Ht 5' 3.75" (1.619 m)   Wt 291 lb (132 kg)   SpO2 97%   BMI 50.34 kg/m   Gen: NAD, resting comfortably HEENT: TMs with clear effusion.  OP erythematous with no exudate.  Nasal coast erythematous and boggy bilaterally. CV: Regular rate and rhythm with no murmurs appreciated Pulm: Normal work of breathing, clear to auscultation bilaterally with no crackles, wheezes, or rhonchi GI: Normal bowel sounds present. Soft, Nontender, Nondistended.  Results for orders placed or performed in visit on 08/13/18 (from the past 24 hour(s))  POCT Influenza A/B     Status: Abnormal   Collection Time: 08/13/18 11:40 AM  Result Value Ref Range   Influenza A, POC Positive (A) Negative   Influenza B, POC Negative Negative        Charday Capetillo M. Jimmey Ralph, MD 08/13/2018 11:44 AM

## 2018-08-13 NOTE — Patient Instructions (Signed)
It was very nice to see you today!  You have the flu.  Please start the Tamiflu.  Let me know if your symptoms worsen or not improve in the next few days.  Please make sure you get plenty of rest and fluids over the next 2 days.  You can take over-the-counter medications as needed.  Take care, Dr Jimmey Ralph

## 2018-08-14 ENCOUNTER — Encounter: Payer: BC Managed Care – PPO | Admitting: Family Medicine

## 2018-08-14 DIAGNOSIS — Z0289 Encounter for other administrative examinations: Secondary | ICD-10-CM

## 2019-09-30 ENCOUNTER — Ambulatory Visit: Payer: Self-pay | Attending: Family

## 2019-09-30 DIAGNOSIS — Z23 Encounter for immunization: Secondary | ICD-10-CM

## 2019-09-30 NOTE — Progress Notes (Signed)
   Covid-19 Vaccination Clinic  Name:  Morgen Linebaugh    MRN: [id number] DOB: 09-23-1980  09/30/2019  Ms. Eischen was observed post Covid-19 immunization for 15 minutes without incident. She was provided with Vaccine Information Sheet and instruction to access the V-Safe system.   Ms. Cermak was instructed to call 911 with any severe reactions post vaccine: Marland Kitchen Difficulty breathing  . Swelling of face and throat  . A fast heartbeat  . A bad rash all over body  . Dizziness and weakness   Immunizations Administered    Name Date Dose VIS Date Route   Moderna COVID-19 Vaccine 09/30/2019  3:18 PM 0.5 mL 06/08/2019 Intramuscular   Manufacturer: Gala Murdoch   Lot: [id number]   NDC: 80777-273-99      Covid-19 Vaccination Clinic  Name:  Estell Puccini    MRN: [id number] DOB: [birth year]  09/30/2019  Ms. Stotts was observed post Covid-19 immunization for 15 minutes without incident. She was provided with Vaccine Information Sheet and instruction to access the V-Safe system.   Ms. Auer was instructed to call 911 with any severe reactions post vaccine: Marland Kitchen Difficulty breathing  . Swelling of face and throat  . A fast heartbeat  . A bad rash all over body  . Dizziness and weakness   Immunizations Administered    Name Date Dose VIS Date Route   Moderna COVID-19 Vaccine 09/30/2019  3:18 PM 0.5 mL 06/08/2019 Intramuscular   Manufacturer: Moderna   Lot: [id number]   NDC: 74944-967-59

## 2019-11-02 ENCOUNTER — Ambulatory Visit: Payer: Self-pay | Attending: Family

## 2019-11-02 DIAGNOSIS — Z23 Encounter for immunization: Secondary | ICD-10-CM

## 2019-11-02 NOTE — Progress Notes (Signed)
   Covid-19 Vaccination Clinic  Name:  Claudia Ferguson    MRN: [id number] DOB: 11/16/1980  11/02/2019  Claudia Ferguson was observed post Covid-19 immunization for 15 minutes without incident. She was provided with Vaccine Information Sheet and instruction to access the V-Safe system.   Claudia Ferguson was instructed to call 911 with any severe reactions post vaccine: Marland Kitchen Difficulty breathing  . Swelling of face and throat  . A fast heartbeat  . A bad rash all over body  . Dizziness and weakness   Immunizations Administered    Name Date Dose VIS Date Route   Moderna COVID-19 Vaccine 11/02/2019  3:10 PM 0.5 mL 06/2019 Intramuscular   Manufacturer: Moderna   Lot: [id number]   NDC: 49702-637-85

## 2020-01-29 ENCOUNTER — Ambulatory Visit: Payer: Self-pay

## 2020-04-26 ENCOUNTER — Other Ambulatory Visit: Payer: Self-pay

## 2020-04-26 ENCOUNTER — Other Ambulatory Visit: Payer: Self-pay | Admitting: Chiropractic Medicine

## 2020-04-26 ENCOUNTER — Ambulatory Visit
Admission: RE | Admit: 2020-04-26 | Discharge: 2020-04-26 | Disposition: A | Discharge status: Home / Self Care | Payer: 59 | Source: Ambulatory Visit | Attending: Chiropractic Medicine | Admitting: Chiropractic Medicine

## 2020-04-26 DIAGNOSIS — M9903 Segmental and somatic dysfunction of lumbar region: Secondary | ICD-10-CM

## 2020-04-26 DIAGNOSIS — R52 Pain, unspecified: Secondary | ICD-10-CM

## 2020-07-10 ENCOUNTER — Ambulatory Visit: Payer: 59

## 2020-07-13 ENCOUNTER — Ambulatory Visit: Payer: 59

## 2021-04-06 IMAGING — CR DG LUMBAR SPINE 2-3V
3 series · 3 of 3 positions shown · non-contrast
Comparison: None.

CLINICAL DATA: Pain

EXAM:
LUMBAR SPINE - 2-3 VIEW

[w lumbar spine ap]
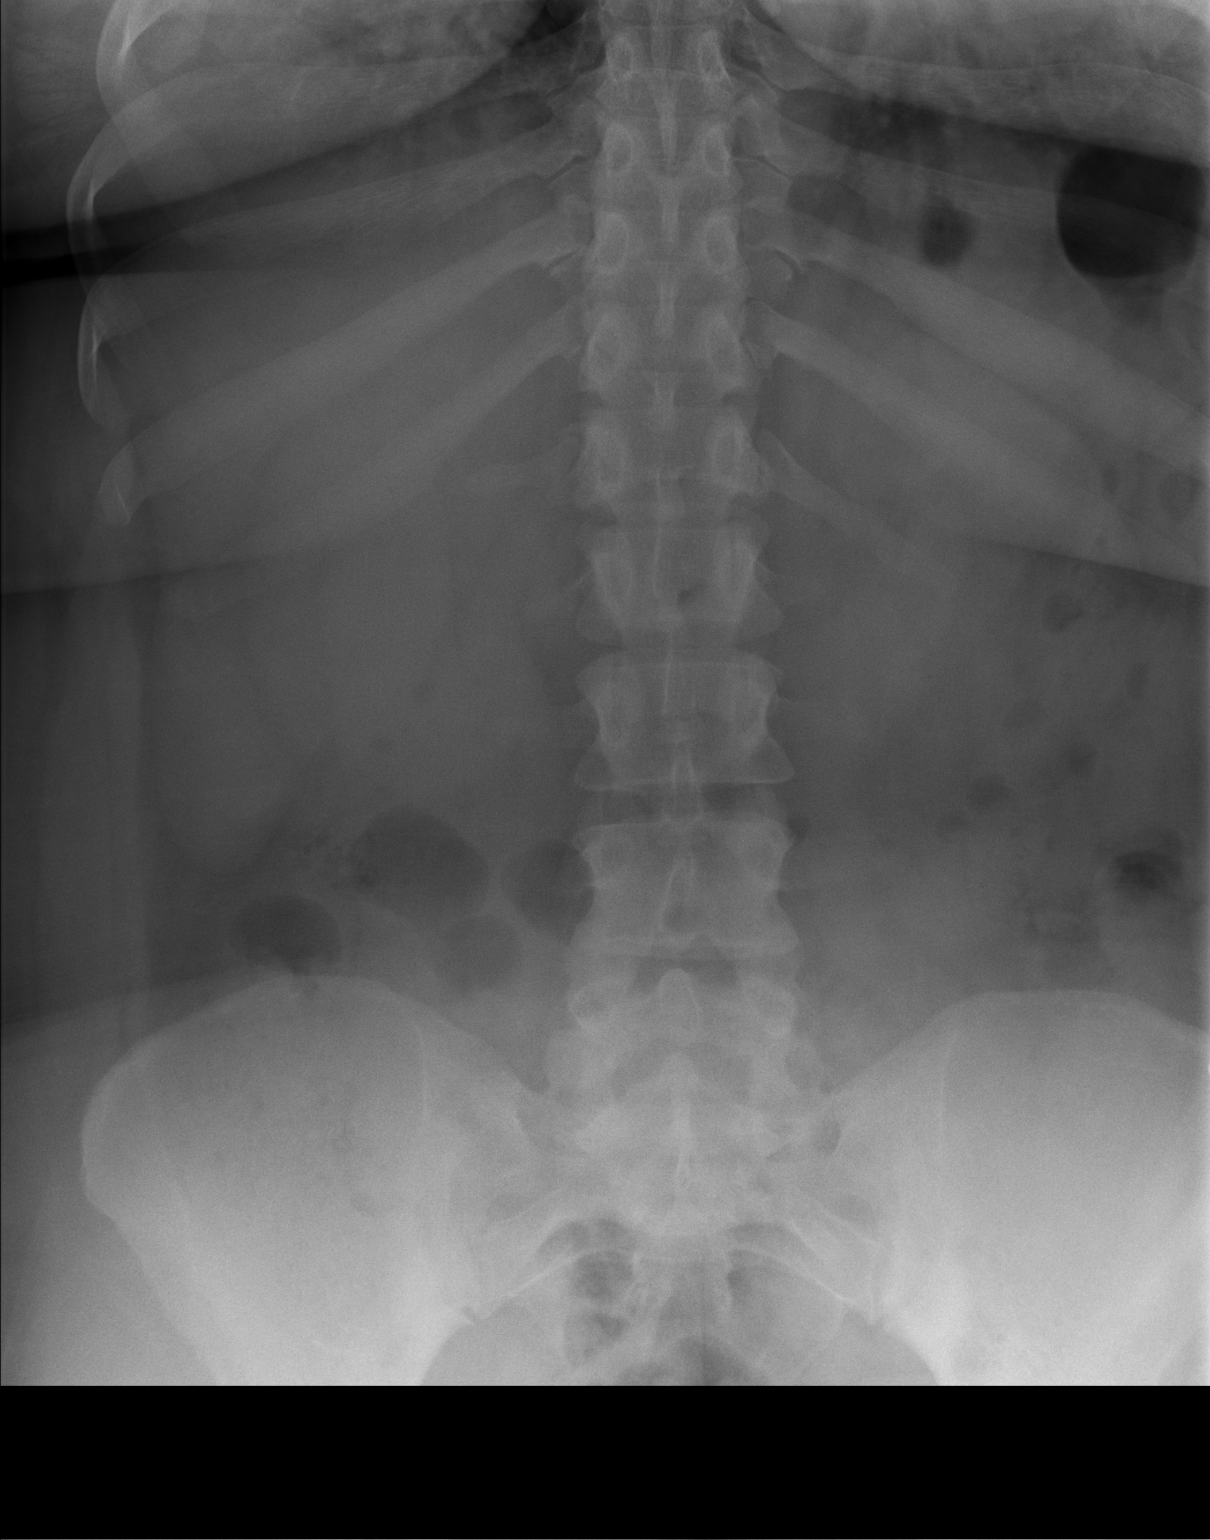

[w lumbar spine lat]
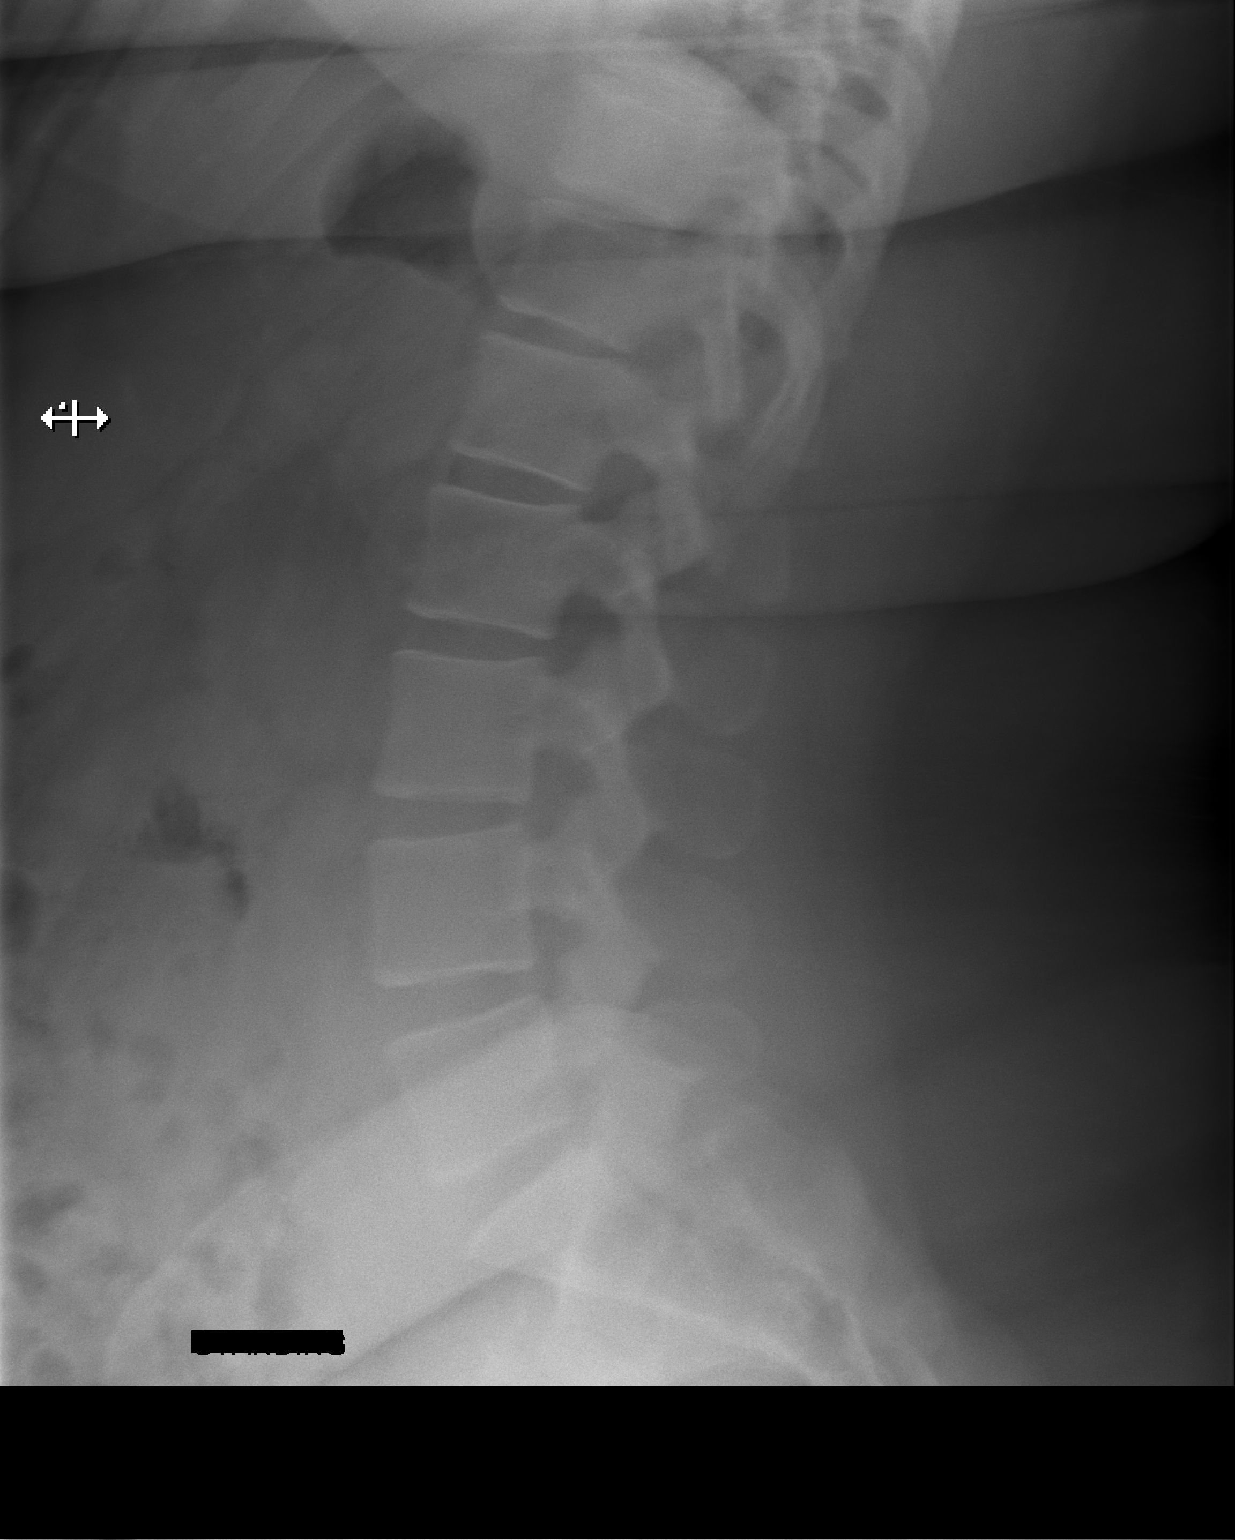

[w lumbar l-5 s-1 spot]
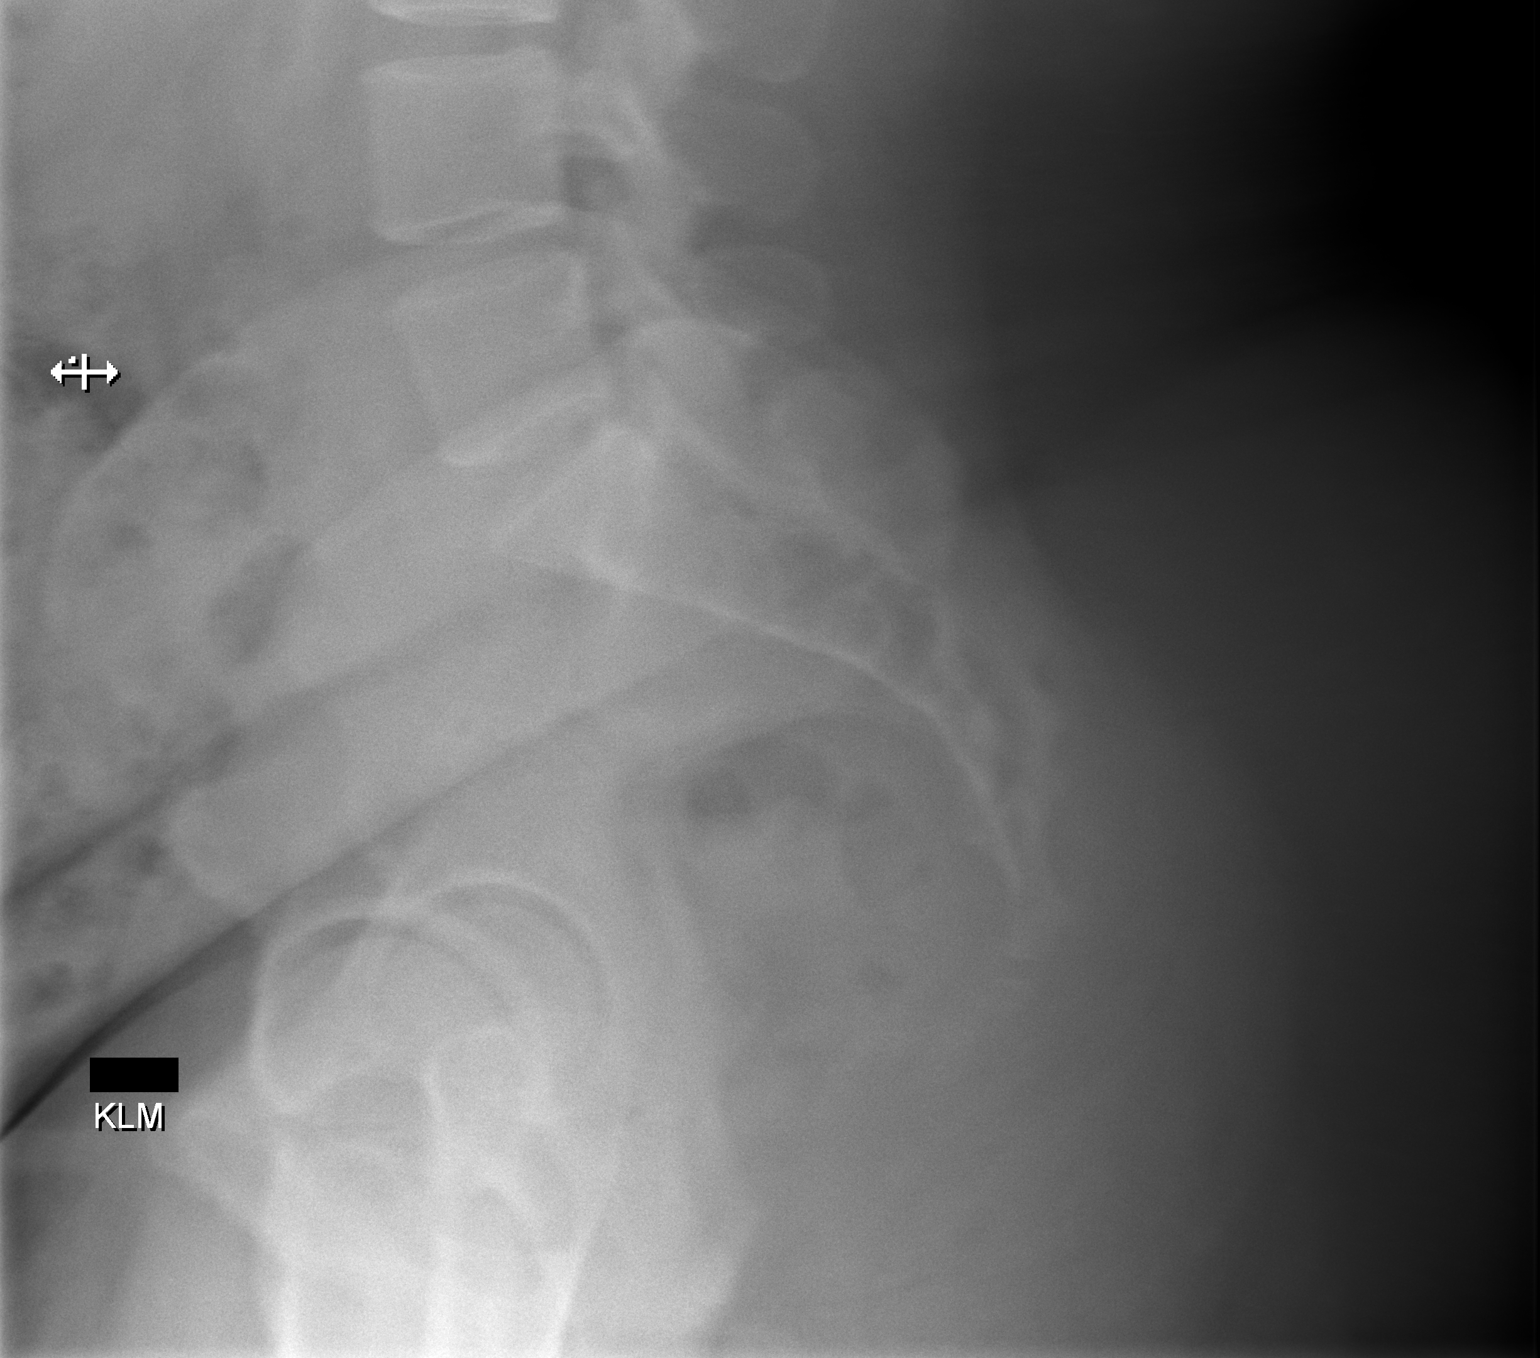

[3 of 3 positions shown; findings below may reference images not displayed]

FINDINGS: There is no evidence of lumbar spine fracture. Alignment is normal.
Transitional lumbosacral anatomy with 4 non rib-bearing vertebral
bodies and L5 sacralization. Intervertebral disc spaces are
maintained.
IMPRESSION: No acute or focal osseous abnormality.

## 2022-04-01 ENCOUNTER — Encounter: Payer: Self-pay | Admitting: *Deleted

## 2022-06-20 ENCOUNTER — Encounter: Payer: Self-pay | Admitting: *Deleted

## 2024-05-06 ENCOUNTER — Other Ambulatory Visit: Payer: Self-pay | Admitting: Family

## 2024-05-06 DIAGNOSIS — N611 Abscess of the breast and nipple: Secondary | ICD-10-CM

## 2024-05-21 ENCOUNTER — Other Ambulatory Visit: Payer: Self-pay | Admitting: Family

## 2024-05-21 DIAGNOSIS — N611 Abscess of the breast and nipple: Secondary | ICD-10-CM

## 2024-05-25 ENCOUNTER — Encounter

## 2024-05-25 ENCOUNTER — Other Ambulatory Visit

## 2024-07-27 ENCOUNTER — Other Ambulatory Visit

## 2024-07-27 ENCOUNTER — Encounter

## 2025-01-10 ENCOUNTER — Ambulatory Visit: Admitting: Family Medicine
